# Patient Record
Sex: Male | Born: 1970 | Race: Black or African American | Hispanic: No | Marital: Married | Smoking: Never smoker
Health system: Southern US, Community
[De-identification: ages and names within clinical notes are randomized; demographics above are authoritative.]

## PROBLEM LIST (undated history)

## (undated) HISTORY — PX: EYE SURGERY: SHX253

---

## 2016-06-28 ENCOUNTER — Emergency Department (HOSPITAL_COMMUNITY)

## 2016-06-28 ENCOUNTER — Encounter (HOSPITAL_COMMUNITY): Payer: Self-pay | Admitting: Emergency Medicine

## 2016-06-28 ENCOUNTER — Emergency Department (HOSPITAL_COMMUNITY)
Admission: EM | Admit: 2016-06-28 | Discharge: 2016-06-28 | Disposition: A | Discharge status: Home / Self Care | Attending: Emergency Medicine | Admitting: Emergency Medicine

## 2016-06-28 DIAGNOSIS — R0789 Other chest pain: Secondary | ICD-10-CM | POA: Diagnosis not present

## 2016-06-28 DIAGNOSIS — Z79899 Other long term (current) drug therapy: Secondary | ICD-10-CM | POA: Diagnosis not present

## 2016-06-28 DIAGNOSIS — R079 Chest pain, unspecified: Secondary | ICD-10-CM | POA: Diagnosis present

## 2016-06-28 LAB — BASIC METABOLIC PANEL
ANION GAP: 7 (ref 5–15)
BUN: 10 mg/dL (ref 6–20)
CALCIUM: 9.1 mg/dL (ref 8.9–10.3)
CO2: 26 mmol/L (ref 22–32)
Chloride: 103 mmol/L (ref 101–111)
Creatinine, Ser: 0.93 mg/dL (ref 0.61–1.24)
Glucose, Bld: 98 mg/dL (ref 65–99)
Potassium: 3.8 mmol/L (ref 3.5–5.1)
SODIUM: 136 mmol/L (ref 135–145)

## 2016-06-28 LAB — CBC
HCT: 45.1 % (ref 39.0–52.0)
HEMOGLOBIN: 16 g/dL (ref 13.0–17.0)
MCH: 29.9 pg (ref 26.0–34.0)
MCHC: 35.5 g/dL (ref 30.0–36.0)
MCV: 84.1 fL (ref 78.0–100.0)
PLATELETS: 239 10*3/uL (ref 150–400)
RBC: 5.36 MIL/uL (ref 4.22–5.81)
RDW: 13.5 % (ref 11.5–15.5)
WBC: 4.5 10*3/uL (ref 4.0–10.5)

## 2016-06-28 LAB — I-STAT TROPONIN, ED
TROPONIN I, POC: 0 ng/mL (ref 0.00–0.08)
Troponin i, poc: 0 ng/mL (ref 0.00–0.08)

## 2016-06-28 MED ORDER — ASPIRIN 81 MG PO CHEW
324.0000 mg | CHEWABLE_TABLET | Freq: Once | ORAL | Status: AC
  Administered 2016-06-28: 324 mg via ORAL
  Filled 2016-06-28: qty 4

## 2016-06-28 MED ORDER — IBUPROFEN 600 MG PO TABS: 600.0000 mg | ORAL_TABLET | Freq: Four times a day (QID) | ORAL | 0 refills | Status: AC | PRN

## 2016-06-28 NOTE — ED Provider Notes (Signed)
[facility]-EMERGENCY DEPT Provider Note   CSN: [id number] Arrival date & time: 06/28/16  0702     History   Chief Complaint Chief Complaint  Patient presents with  . Chest Pain    HPI Erroll LunaJacque Aleman is a 45 y.o. male.  Pt presents to the ED this AM with pain to the right side of his chest.  He said he woke up with the pain.  The pain seems to hurt more with movement.  The pt said that it feels like a tap.  No pressure.  No sob, no n/v.      History reviewed. No pertinent past medical history.  There are no active problems to display for this patient.   Past Surgical History:  Procedure Laterality Date  . EYE SURGERY         Home Medications    Prior to Admission medications   Medication Sig Start Date End Date Taking? Authorizing Provider  ibuprofen (ADVIL,MOTRIN) 600 MG tablet Take 1 tablet (600 mg total) by mouth every 6 (six) hours as needed. 06/28/16   Jacalyn LefevreJulie Chayim Bialas, MD    Family History Family History  Problem Relation Age of Onset  . Adopted: Yes    Social History Social History  Substance Use Topics  . Smoking status: Never Smoker  . Smokeless tobacco: Never Used  . Alcohol use No     Allergies   Shellfish allergy   Review of Systems Review of Systems  Cardiovascular: Positive for chest pain.  All other systems reviewed and are negative.    Physical Exam Updated Vital Signs BP (!) 121/105   Pulse 66   Temp 98.1 F (36.7 C) (Oral)   Resp 22   Ht 5\' 7"  (1.702 m)   Wt 202 lb (91.6 kg)   SpO2 98%   BMI 31.64 kg/m   Physical Exam  Constitutional: He is oriented to person, place, and time. He appears well-developed and well-nourished.  HENT:  Head: Normocephalic and atraumatic.  Right Ear: External ear normal.  Left Ear: External ear normal.  Nose: Nose normal.  Mouth/Throat: Oropharynx is clear and moist.  Eyes: Conjunctivae and EOM are normal. Pupils are equal, round, and reactive to light.  Neck: Normal range of motion. Neck  supple.  Cardiovascular: Normal rate, regular rhythm, normal heart sounds and intact distal pulses.   Pulmonary/Chest: Effort normal and breath sounds normal.  Abdominal: Soft. Bowel sounds are normal.  Musculoskeletal: Normal range of motion.  Neurological: He is alert and oriented to person, place, and time.  Skin: Skin is warm.  Psychiatric: He has a normal mood and affect. His behavior is normal. Judgment and thought content normal.  Nursing note and vitals reviewed.    ED Treatments / Results  Labs (all labs ordered are listed, but only abnormal results are displayed) Labs Reviewed  BASIC METABOLIC PANEL  CBC  I-STAT TROPOININ, ED  Rosezena SensorI-STAT TROPOININ, ED    EKG  EKG Interpretation  Date/Time:  Tuesday June 28 2016 07:09:47 EST Ventricular Rate:  73 PR Interval:    QRS Duration: 85 QT Interval:  356 QTC Calculation: 393 R Axis:   20 Text Interpretation:  Sinus rhythm Confirmed by Particia NearingHAVILAND MD, Aras Albarran ([id number]) on 06/28/2016 7:38:13 AM       Radiology Dg Chest 2 View  Result Date: 06/28/2016 CLINICAL DATA:  New right chest pain since this morning. EXAM: CHEST  2 VIEW COMPARISON:  None. FINDINGS: The heart size and mediastinal contours are within normal limits. There is  no focal infiltrate, pulmonary edema, or pleural effusion. There is scoliosis of spine. The visualized skeletal structures are otherwise unremarkable. IMPRESSION: No active cardiopulmonary disease. Electronically Signed   By: Sherian ReinWei-Chen  Lin M.D.   On: 06/28/2016 07:50    Procedures Procedures (including critical care time)  Medications Ordered in ED Medications  aspirin chewable tablet 324 mg (324 mg Oral Given 06/28/16 0754)     Initial Impression / Assessment and Plan / ED Course  I have reviewed the triage vital signs and the nursing notes.  Pertinent labs & imaging results that were available during my care of the patient were reviewed by me and considered in my medical decision making (see  chart for details).  Clinical Course    2 sets of negative troponins in a patient with very low risk for cad.  Pt ok for d/c.  I think sx are msk in origin.   Pt has insurance, but no doctor.  I told him that he needs to establish pcp to recheck bp as it has been borderline high here.  Pt knows to return if worse.  Final Clinical Impressions(s) / ED Diagnoses   Final diagnoses:  Atypical chest pain    New Prescriptions New Prescriptions   IBUPROFEN (ADVIL,MOTRIN) 600 MG TABLET    Take 1 tablet (600 mg total) by mouth every 6 (six) hours as needed.     Jacalyn LefevreJulie Caidin Heidenreich, MD 06/28/16 [id number]

## 2016-06-28 NOTE — ED Triage Notes (Signed)
Pt states he woke up this morning with pain in his right chest  Pt states the more he moved around this morning the worse it got  Pt states it feels like a pulling sensation  Denies any sxs associated with the pain

## 2018-05-28 IMAGING — CR DG CHEST 2V
2 series · 2 of 2 positions shown · non-contrast
Comparison: None.

CLINICAL DATA: New right chest pain since this morning.

EXAM:
CHEST  2 VIEW

[w chest pa]
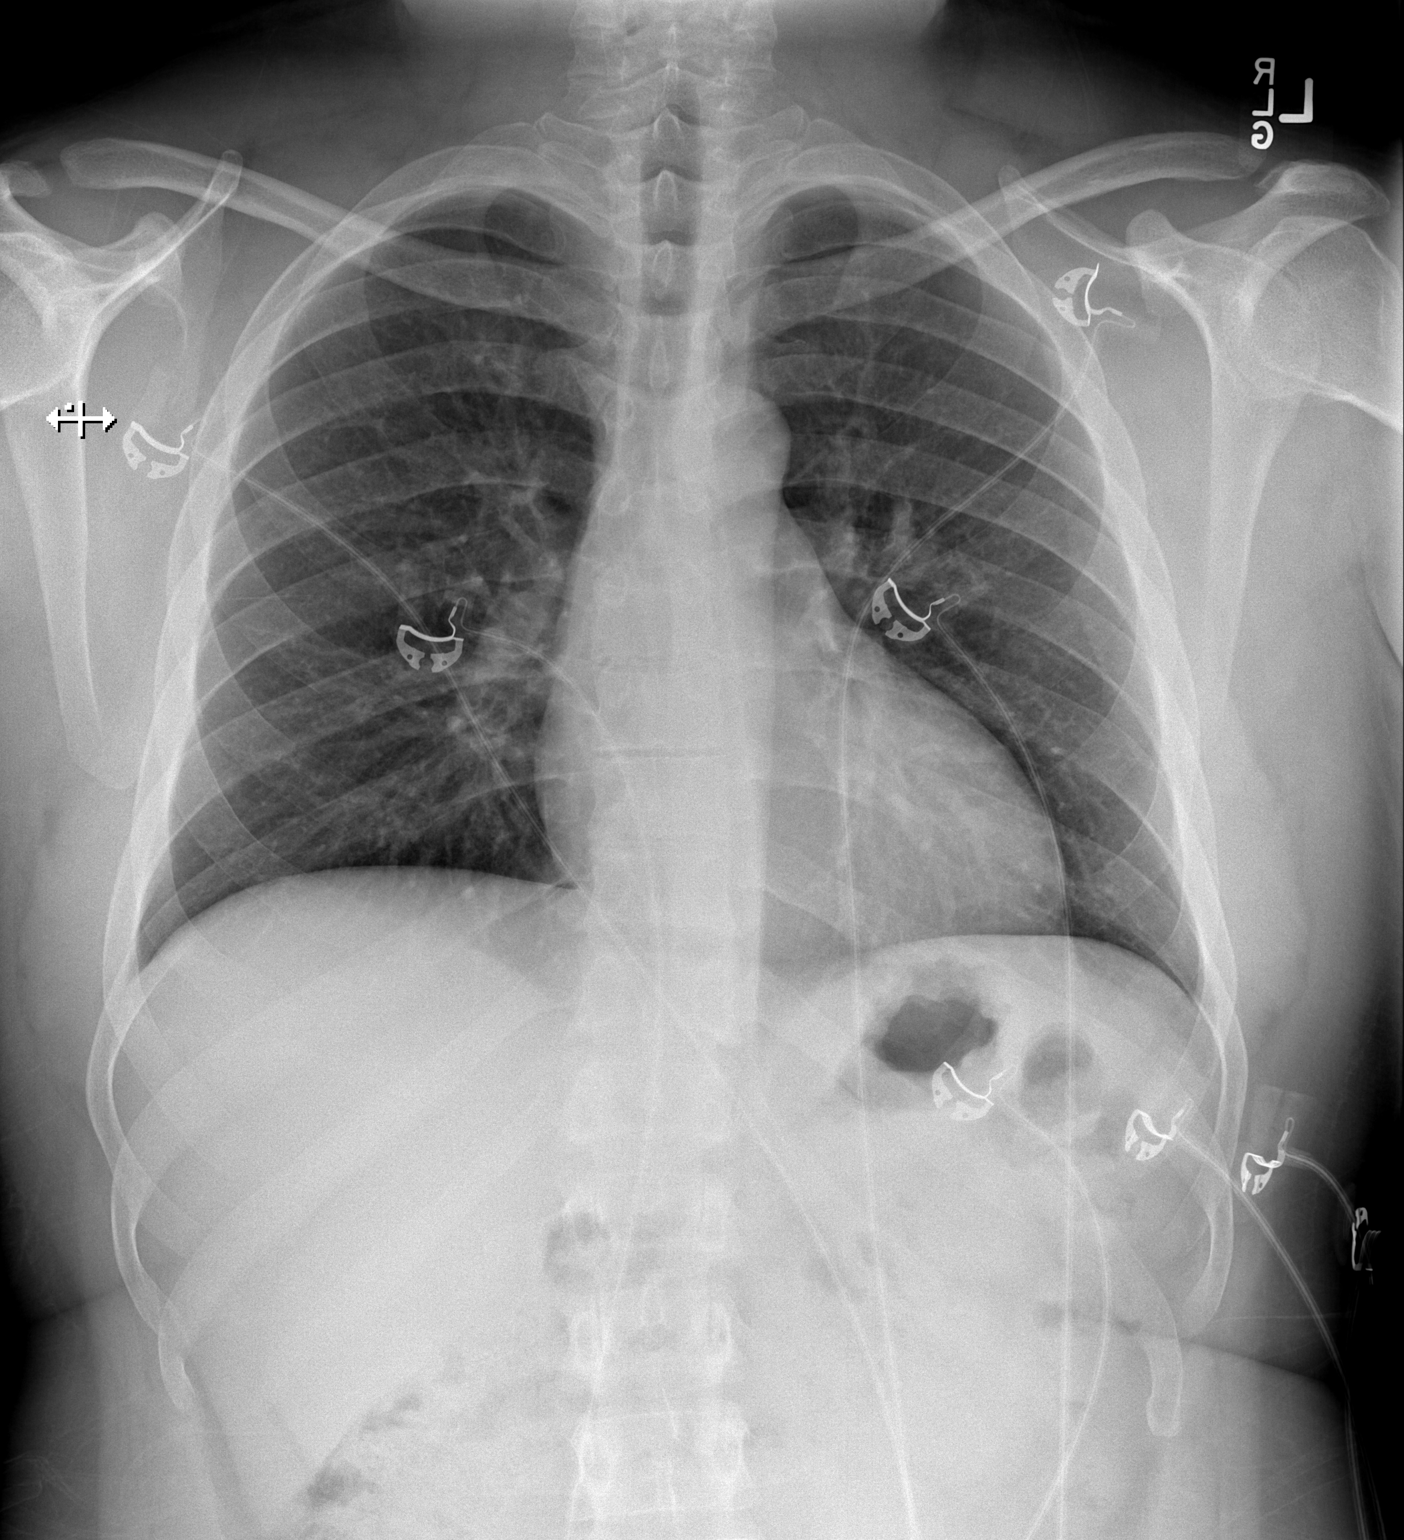

[w chest lat]
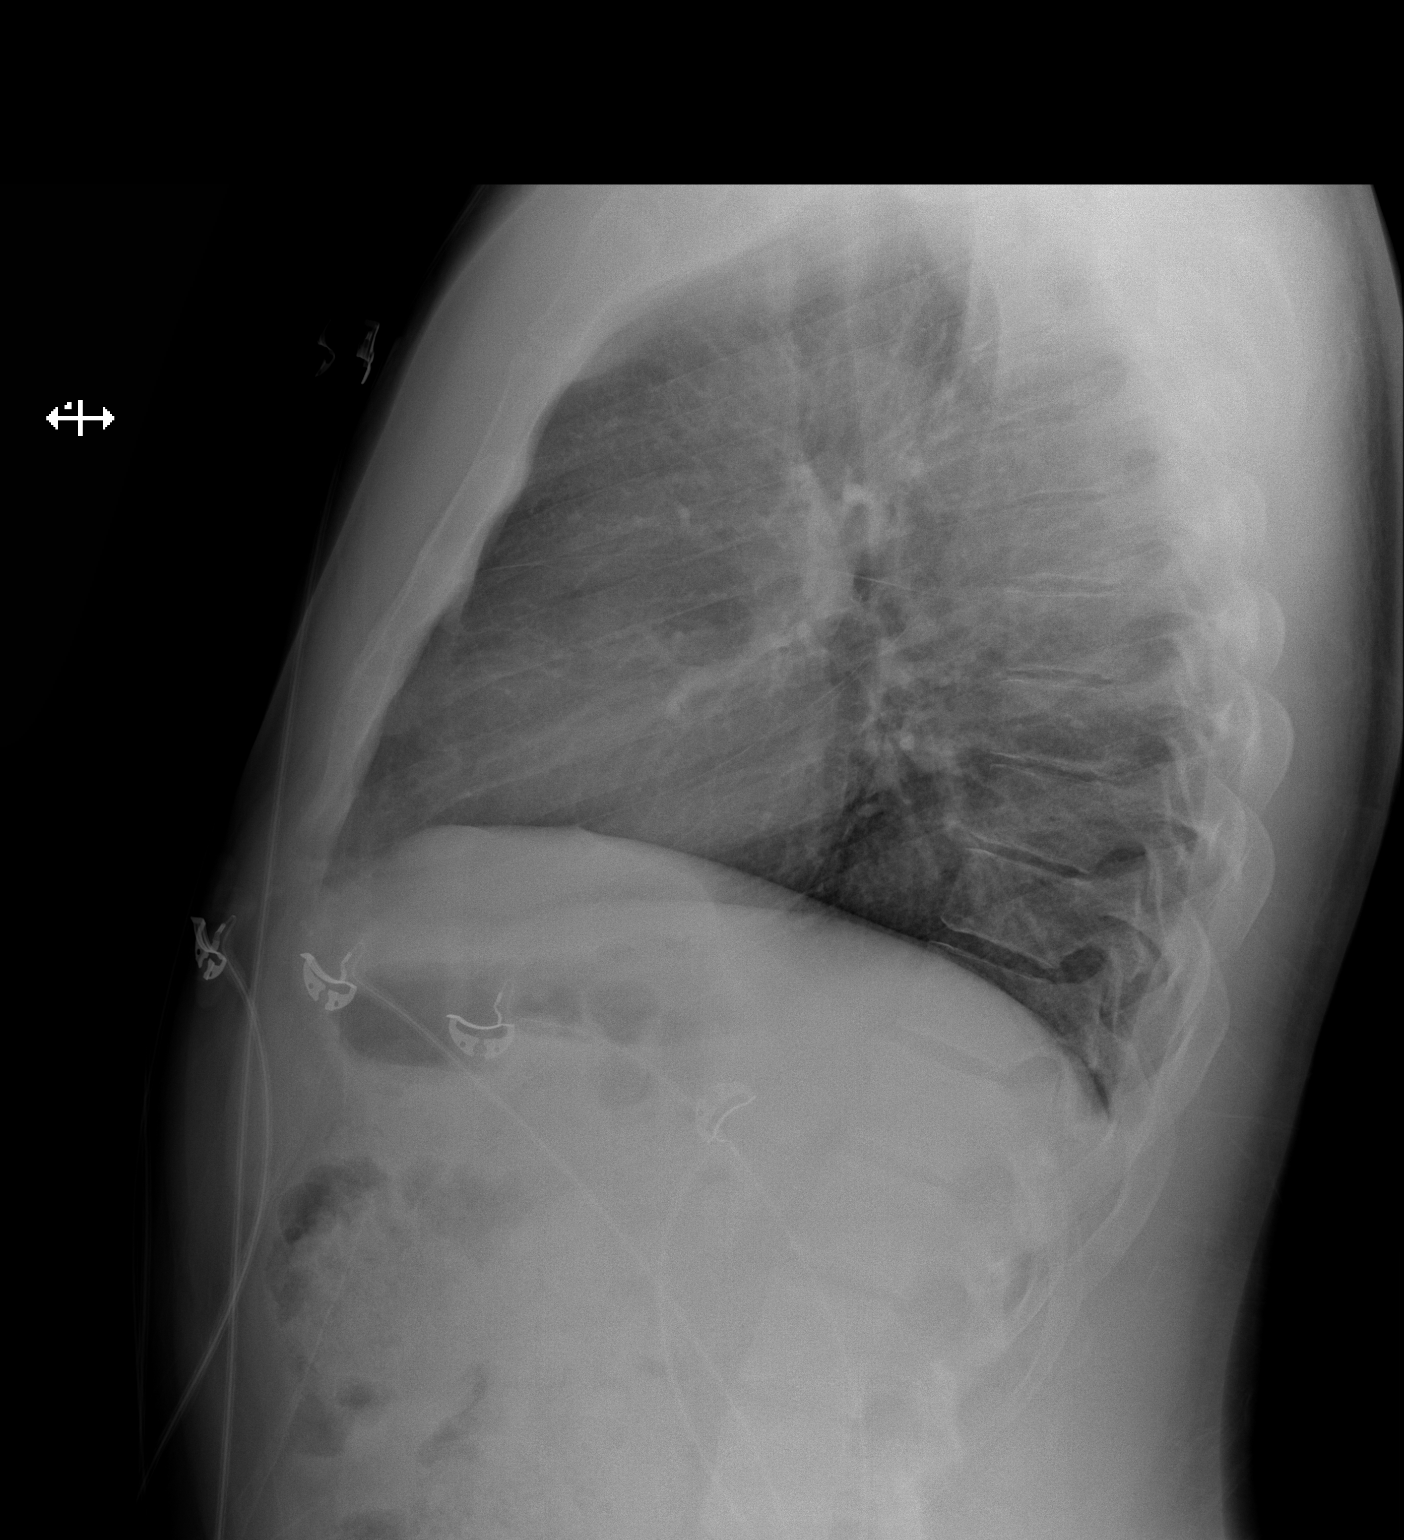

[2 of 2 positions shown; findings below may reference images not displayed]

FINDINGS: The heart size and mediastinal contours are within normal limits.
There is no focal infiltrate, pulmonary edema, or pleural effusion.
There is scoliosis of spine. The visualized skeletal structures are
otherwise unremarkable.
IMPRESSION: No active cardiopulmonary disease.

## 2023-02-12 ENCOUNTER — Encounter (HOSPITAL_BASED_OUTPATIENT_CLINIC_OR_DEPARTMENT_OTHER): Payer: Self-pay | Admitting: Emergency Medicine

## 2023-02-12 ENCOUNTER — Other Ambulatory Visit: Payer: Self-pay

## 2023-02-12 ENCOUNTER — Emergency Department (HOSPITAL_BASED_OUTPATIENT_CLINIC_OR_DEPARTMENT_OTHER)
Admission: EM | Admit: 2023-02-12 | Discharge: 2023-02-12 | Disposition: A | Discharge status: Home / Self Care | Attending: Emergency Medicine | Admitting: Emergency Medicine

## 2023-02-12 DIAGNOSIS — M25512 Pain in left shoulder: Secondary | ICD-10-CM | POA: Diagnosis present

## 2023-02-12 DIAGNOSIS — M62838 Other muscle spasm: Secondary | ICD-10-CM | POA: Diagnosis not present

## 2023-02-12 DIAGNOSIS — M62830 Muscle spasm of back: Secondary | ICD-10-CM

## 2023-02-12 MED ORDER — LIDOCAINE 5 % EX PTCH: 1.0000 | MEDICATED_PATCH | CUTANEOUS | 0 refills | Status: AC

## 2023-02-12 MED ORDER — LIDOCAINE 5 % EX PTCH
1.0000 | MEDICATED_PATCH | CUTANEOUS | Status: DC
  Administered 2023-02-12: 1 via TRANSDERMAL
  Filled 2023-02-12: qty 1

## 2023-02-12 MED ORDER — KETOROLAC TROMETHAMINE 30 MG/ML IJ SOLN
30.0000 mg | Freq: Once | INTRAMUSCULAR | Status: AC
  Administered 2023-02-12: 30 mg via INTRAMUSCULAR
  Filled 2023-02-12: qty 1

## 2023-02-12 MED ORDER — METHOCARBAMOL 500 MG PO TABS
500.0000 mg | ORAL_TABLET | Freq: Once | ORAL | Status: AC
  Administered 2023-02-12: 500 mg via ORAL
  Filled 2023-02-12: qty 1

## 2023-02-12 MED ORDER — NAPROXEN 500 MG PO TABS: 500.0000 mg | ORAL_TABLET | Freq: Two times a day (BID) | ORAL | 0 refills | Status: AC

## 2023-02-12 MED ORDER — METHOCARBAMOL 500 MG PO TABS: 500.0000 mg | ORAL_TABLET | Freq: Two times a day (BID) | ORAL | 0 refills | Status: AC

## 2023-02-12 NOTE — ED Triage Notes (Signed)
Pt states he noticed L shoulder pain when he was trying to sleep Wednesday night. He feels its a pulled muscle from working [employer]. Pinpoints pain to shoulder blade area. He states it is worse when he is trying to lay down or sit up straight. He states when he is working he does not feel it, but it is more bothersome at rest. Also reports intermittent tingling in L arm. No noted changes to strength. Denies chest pain, SHOB or other sx.

## 2023-02-12 NOTE — ED Provider Notes (Signed)
[place] EMERGENCY DEPARTMENT AT [facility]  Provider Note  CSN: [id number] Arrival date & time: 02/12/23 0540  History Chief Complaint  Patient presents with   L shoulder pain    Tyler Baker is a 52 y.o. 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 Difficulty getting comfortable at night. Some aching/tingling in L arm. No weakness.    Home Medications Prior to Admission medications   Medication Sig Start Date End Date Taking? Authorizing Provider  lidocaine (LIDODERM) 5 % Place 1 patch onto the skin daily. Remove & Discard patch within 12 hours or as directed by MD 02/12/23  Yes Pollyann Savoy, MD  methocarbamol (ROBAXIN) 500 MG tablet Take 1 tablet (500 mg total) by mouth 2 (two) times daily. 02/12/23  Yes Pollyann Savoy, MD  naproxen (NAPROSYN) 500 MG tablet Take 1 tablet (500 mg total) by mouth 2 (two) times daily. 02/12/23  Yes Pollyann Savoy, MD  ibuprofen (ADVIL,MOTRIN) 600 MG tablet Take 1 tablet (600 mg total) by mouth every 6 (six) hours as needed. 06/28/16   Jacalyn Lefevre, MD     Allergies    Shellfish allergy   Review of Systems   Review of Systems Please see HPI for pertinent positives and negatives  Physical Exam BP (!) 159/88 (BP Location: Right Arm)   Pulse 70   Temp 98.4 F (36.9 C)   Resp 18   Ht 5\' 7"  (1.702 m)   Wt 97.5 kg   SpO2 98%   BMI 33.67 kg/m   Physical Exam Vitals and nursing note reviewed.  Constitutional:      Appearance: Normal appearance.  HENT:     Head: Normocephalic and atraumatic.     Nose: Nose normal.     Mouth/Throat:     Mouth: Mucous membranes are moist.  Eyes:     Extraocular Movements: Extraocular movements intact.     Conjunctiva/sclera: Conjunctivae normal.  Cardiovascular:     Rate and Rhythm: Normal rate.      Pulses: Normal pulses.  Pulmonary:     Effort: Pulmonary effort is normal.     Breath sounds: Normal breath sounds. No wheezing.  Abdominal:     General: Abdomen is flat.     Palpations: Abdomen is soft.     Tenderness: There is no abdominal tenderness.  Musculoskeletal:        General: Tenderness (tenderness over the L trapezius) present. No swelling. Normal range of motion.     Cervical back: Neck supple.     Comments: Negative phalen's and tinel's signs.   Skin:    General: Skin is warm and dry.  Neurological:     General: No focal deficit present.     Mental Status: He is alert.  Psychiatric:        Mood and Affect: Mood normal.     ED Results / Procedures / Treatments   EKG None  Procedures Procedures  Medications Ordered in the ED Medications  ketorolac (TORADOL) 30 MG/ML injection 30 mg (has no administration in time range)  methocarbamol (ROBAXIN) tablet 500 mg (has no administration in time range)  lidocaine (LIDODERM) 5 % 1 patch (has no administration in time range)    Initial Impression and Plan  Patient here with MSK L shoulder pain, likely trapezius  spasm. Plan Toradol, Robaxin and lidoderm here. Rx for Naprosyn. Rest, heat, massage and PCP follow up, RTED for any other concerns.    ED Course       MDM Rules/Calculators/A&P Medical Decision Making Problems Addressed: Spasm of left trapezius muscle: acute illness or injury  Risk Prescription drug management.     Final Clinical Impression(s) / ED Diagnoses Final diagnoses:  Spasm of left trapezius muscle    Rx / DC Orders ED Discharge Orders          Ordered    lidocaine (LIDODERM) 5 %  Every 24 hours        02/12/23 0606    methocarbamol (ROBAXIN) 500 MG tablet  2 times daily        02/12/23 0606    naproxen (NAPROSYN) 500 MG tablet  2 times daily        02/12/23 0606             Pollyann Savoy, MD 02/12/23 [phone]

## 2023-02-12 NOTE — ED Notes (Signed)
Reviewed AVS with patient, patient expressed understanding of directions, denies further questions at this time. 

## 2023-07-09 ENCOUNTER — Other Ambulatory Visit: Payer: Self-pay

## 2023-07-09 ENCOUNTER — Emergency Department (HOSPITAL_BASED_OUTPATIENT_CLINIC_OR_DEPARTMENT_OTHER)
Admission: EM | Admit: 2023-07-09 | Discharge: 2023-07-09 | Disposition: A | Discharge status: Home / Self Care | Attending: Emergency Medicine | Admitting: Emergency Medicine

## 2023-07-09 ENCOUNTER — Encounter (HOSPITAL_BASED_OUTPATIENT_CLINIC_OR_DEPARTMENT_OTHER): Payer: Self-pay

## 2023-07-09 DIAGNOSIS — R059 Cough, unspecified: Secondary | ICD-10-CM | POA: Diagnosis present

## 2023-07-09 DIAGNOSIS — J069 Acute upper respiratory infection, unspecified: Secondary | ICD-10-CM | POA: Insufficient documentation

## 2023-07-09 MED ORDER — ONDANSETRON 4 MG PO TBDP: ORAL_TABLET | ORAL | 0 refills | Status: AC

## 2023-07-09 MED ORDER — BENZONATATE 100 MG PO CAPS: 100.0000 mg | ORAL_CAPSULE | Freq: Three times a day (TID) | ORAL | 0 refills | Status: AC

## 2023-07-09 NOTE — ED Triage Notes (Signed)
Pt reports persistent cough for 1 day. Pt states mucous goes back and forth between brown and clear. Mild fever 101F yesterday. No fever last night per patient. (-) Flu, (-) Covid test at home. Denies SOB.

## 2023-07-09 NOTE — Discharge Instructions (Signed)
Take tylenol 2 pills 4 times a day and motrin 4 pills 3 times a day.  Drink plenty of fluids.  Return for worsening shortness of breath, headache, confusion. Follow up with your family doctor.   

## 2023-07-09 NOTE — ED Provider Notes (Signed)
[place] EMERGENCY DEPARTMENT AT [facility] Provider Note   CSN: [id number] Arrival date & time: 07/09/23  0555     History  Chief Complaint  Patient presents with   Cough    Tyler Baker is a 52 y.o. male.  52 yo M chief complaints of cough and congestion.  Going on since yesterday.  Had a fever as high as 101.  He took a home COVID and flu test that was negative.  Needled his wife that his sputum was brown and so she brought him here for evaluation.  He denies any difficulty breathing.  Feels like he is making and drinking normally.  His supervisor was sick recently but he does not think that he was in close contact with them.   Cough      Home Medications Prior to Admission medications   Medication Sig Start Date End Date Taking? Authorizing Provider  benzonatate (TESSALON) 100 MG capsule Take 1 capsule (100 mg total) by mouth every 8 (eight) hours. 07/09/23  Yes Melene Plan, DO  ondansetron (ZOFRAN-ODT) 4 MG disintegrating tablet 4mg  ODT q4 hours prn nausea/vomit 07/09/23  Yes Melene Plan, DO  ibuprofen (ADVIL,MOTRIN) 600 MG tablet Take 1 tablet (600 mg total) by mouth every 6 (six) hours as needed. 06/28/16   Jacalyn Lefevre, MD  lidocaine (LIDODERM) 5 % Place 1 patch onto the skin daily. Remove & Discard patch within 12 hours or as directed by MD 02/12/23   Pollyann Savoy, MD  methocarbamol (ROBAXIN) 500 MG tablet Take 1 tablet (500 mg total) by mouth 2 (two) times daily. 02/12/23   Pollyann Savoy, MD  naproxen (NAPROSYN) 500 MG tablet Take 1 tablet (500 mg total) by mouth 2 (two) times daily. 02/12/23   Pollyann Savoy, MD      Allergies    Shellfish allergy    Review of Systems   Review of Systems  Respiratory:  Positive for cough.     Physical Exam Updated Vital Signs BP (!) 147/96 (BP Location: Right Arm)   Pulse 84   Temp 98.4 F (36.9 C) (Oral)   Resp 15   SpO2 96%  Physical Exam Vitals and nursing note reviewed.  Constitutional:       Appearance: He is well-developed.  HENT:     Head: Normocephalic and atraumatic.     Comments: Swollen turbinates, posterior nasal drip, no noted sinus ttp, tm normal bilaterally.   Eyes:     Pupils: Pupils are equal, round, and reactive to light.  Neck:     Vascular: No JVD.  Cardiovascular:     Rate and Rhythm: Normal rate and regular rhythm.     Heart sounds: No murmur heard.    No friction rub. No gallop.  Pulmonary:     Effort: No respiratory distress.     Breath sounds: No wheezing.  Abdominal:     General: There is no distension.     Tenderness: There is no abdominal tenderness. There is no guarding or rebound.  Musculoskeletal:        General: Normal range of motion.     Cervical back: Normal range of motion and neck supple.  Skin:    Coloration: Skin is not pale.     Findings: No rash.  Neurological:     Mental Status: He is alert and oriented to person, place, and time.  Psychiatric:        Behavior: Behavior normal.     ED Results /  Procedures / Treatments   Labs (all labs ordered are listed, but only abnormal results are displayed) Labs Reviewed - No data to display  EKG None  Radiology No results found.  Procedures Procedures    Medications Ordered in ED Medications - No data to display  ED Course/ Medical Decision Making/ A&P                                 Medical Decision Making Risk Prescription drug management.   52 yo M with a chief complaints of cough congestion going on for about 24 to 48 hours.  Having fevers at home as well.  He is well-appearing nontoxic.  Clear lung sounds for me.  No bacterial source was found on exam.  Will treat supportively.  PCP follow-up.  6:18 AM:  I have discussed the diagnosis/risks/treatment options with the patient and family.  Evaluation and diagnostic testing in the emergency department does not suggest an emergent condition requiring admission or immediate intervention beyond what has been  performed at this time.  They will follow up with PCP. We also discussed returning to the ED immediately if new or worsening sx occur. We discussed the sx which are most concerning (e.g., sudden worsening pain, fever, inability to tolerate by mouth) that necessitate immediate return. Medications administered to the patient during their visit and any new prescriptions provided to the patient are listed below.  Medications given during this visit Medications - No data to display   The patient appears reasonably screen and/or stabilized for discharge and I doubt any other medical condition or other [facility] requiring further screening, evaluation, or treatment in the ED at this time prior to discharge.          Final Clinical Impression(s) / ED Diagnoses Final diagnoses:  Viral URI with cough    Rx / DC Orders ED Discharge Orders          Ordered    benzonatate (TESSALON) 100 MG capsule  Every 8 hours        07/09/23 0616    ondansetron (ZOFRAN-ODT) 4 MG disintegrating tablet        07/09/23 0616              Melene Plan, DO 07/09/23 [phone]

## 2023-07-30 ENCOUNTER — Emergency Department (HOSPITAL_BASED_OUTPATIENT_CLINIC_OR_DEPARTMENT_OTHER)
Admission: EM | Admit: 2023-07-30 | Discharge: 2023-07-30 | Disposition: A | Discharge status: Home / Self Care | Attending: Emergency Medicine | Admitting: Emergency Medicine

## 2023-07-30 ENCOUNTER — Emergency Department (HOSPITAL_BASED_OUTPATIENT_CLINIC_OR_DEPARTMENT_OTHER)

## 2023-07-30 ENCOUNTER — Encounter (HOSPITAL_BASED_OUTPATIENT_CLINIC_OR_DEPARTMENT_OTHER): Payer: Self-pay | Admitting: Emergency Medicine

## 2023-07-30 DIAGNOSIS — J069 Acute upper respiratory infection, unspecified: Secondary | ICD-10-CM | POA: Insufficient documentation

## 2023-07-30 DIAGNOSIS — Z20822 Contact with and (suspected) exposure to covid-19: Secondary | ICD-10-CM | POA: Insufficient documentation

## 2023-07-30 DIAGNOSIS — R059 Cough, unspecified: Secondary | ICD-10-CM | POA: Diagnosis present

## 2023-07-30 DIAGNOSIS — J101 Influenza due to other identified influenza virus with other respiratory manifestations: Secondary | ICD-10-CM | POA: Insufficient documentation

## 2023-07-30 DIAGNOSIS — J111 Influenza due to unidentified influenza virus with other respiratory manifestations: Secondary | ICD-10-CM

## 2023-07-30 LAB — RESP PANEL BY RT-PCR (RSV, FLU A&B, COVID)  RVPGX2
Influenza A by PCR: POSITIVE — AB
Influenza B by PCR: NEGATIVE
Resp Syncytial Virus by PCR: NEGATIVE
SARS Coronavirus 2 by RT PCR: NEGATIVE

## 2023-07-30 MED ORDER — ACETAMINOPHEN 325 MG PO TABS
650.0000 mg | ORAL_TABLET | Freq: Once | ORAL | Status: AC
  Administered 2023-07-30: 650 mg via ORAL
  Filled 2023-07-30: qty 2

## 2023-07-30 MED ORDER — OSELTAMIVIR PHOSPHATE 75 MG PO CAPS
75.0000 mg | ORAL_CAPSULE | Freq: Once | ORAL | Status: AC
  Administered 2023-07-30: 75 mg via ORAL
  Filled 2023-07-30: qty 1

## 2023-07-30 MED ORDER — BENZONATATE 100 MG PO CAPS
100.0000 mg | ORAL_CAPSULE | Freq: Once | ORAL | Status: AC
  Administered 2023-07-30: 100 mg via ORAL
  Filled 2023-07-30: qty 1

## 2023-07-30 MED ORDER — ONDANSETRON HCL 4 MG PO TABS
4.0000 mg | ORAL_TABLET | Freq: Once | ORAL | Status: DC
  Filled 2023-07-30: qty 1

## 2023-07-30 MED ORDER — ONDANSETRON 4 MG PO TBDP
4.0000 mg | ORAL_TABLET | Freq: Once | ORAL | Status: AC
  Administered 2023-07-30: 4 mg via ORAL

## 2023-07-30 MED ORDER — ONDANSETRON 4 MG PO TBDP
ORAL_TABLET | ORAL | Status: AC
  Filled 2023-07-30: qty 1

## 2023-07-30 MED ORDER — OSELTAMIVIR PHOSPHATE 75 MG PO CAPS: 75.0000 mg | ORAL_CAPSULE | Freq: Two times a day (BID) | ORAL | 0 refills | Status: AC

## 2023-07-30 MED ORDER — ONDANSETRON HCL 4 MG PO TABS: 4.0000 mg | ORAL_TABLET | Freq: Four times a day (QID) | ORAL | 0 refills | Status: AC | PRN

## 2023-07-30 NOTE — ED Triage Notes (Signed)
 Pt reports cough x 24 hrs

## 2023-07-30 NOTE — ED Provider Notes (Signed)
  EMERGENCY DEPARTMENT AT [facility] Provider Note   CSN: [id number] Arrival date & time: 07/30/23  1750     History Chief Complaint  Patient presents with   Cough    HPI Tyler Baker is a 53 y.o. male presenting for fever cough congestion for 24 hours.  Recently ill with a URI.  He spontaneously improved.  However starting this morning he had sudden onset severe cough and fever.  Some mild shortness of breath. Ambulatory tolerating p.o. intake just generally ill.   Patient's recorded medical, surgical, social, medication list and allergies were reviewed in the Snapshot window as part of the initial history.   Review of Systems   Review of Systems  Constitutional:  Positive for fever. Negative for chills.  HENT:  Positive for congestion. Negative for ear pain and sore throat.   Eyes:  Negative for pain and visual disturbance.  Respiratory:  Positive for cough. Negative for shortness of breath.   Cardiovascular:  Negative for chest pain and palpitations.  Gastrointestinal:  Negative for abdominal pain and vomiting.  Genitourinary:  Negative for dysuria and hematuria.  Musculoskeletal:  Negative for arthralgias and back pain.  Skin:  Negative for color change and rash.  Neurological:  Negative for seizures and syncope.  All other systems reviewed and are negative.   Physical Exam Updated Vital Signs BP 110/75   Pulse 95   Temp 99.2 F (37.3 C) (Oral)   Resp 16   Ht 5' 7 (1.702 m)   Wt 97.5 kg   SpO2 97%   BMI 33.67 kg/m  Physical Exam Vitals and nursing note reviewed.  Constitutional:      General: He is not in acute distress.    Appearance: He is well-developed.  HENT:     Head: Normocephalic and atraumatic.  Eyes:     Conjunctiva/sclera: Conjunctivae normal.  Cardiovascular:     Rate and Rhythm: Normal rate and regular rhythm.     Heart sounds: No murmur heard. Pulmonary:     Effort: Pulmonary effort is normal. No respiratory  distress.     Breath sounds: Rhonchi (Audible rhonchi in the left base.  Not present in either lung field.) present.  Abdominal:     Palpations: Abdomen is soft.     Tenderness: There is no abdominal tenderness.  Musculoskeletal:        General: No swelling.     Cervical back: Neck supple.  Skin:    General: Skin is warm and dry.     Capillary Refill: Capillary refill takes less than 2 seconds.  Neurological:     Mental Status: He is alert.  Psychiatric:        Mood and Affect: Mood normal.      ED Course/ Medical Decision Making/ A&P    Procedures Procedures   Medications Ordered in ED Medications  oseltamivir  (TAMIFLU ) capsule 75 mg (has no administration in time range)  acetaminophen  (TYLENOL ) tablet 650 mg (650 mg Oral Given 07/30/23 1806)  benzonatate  (TESSALON ) capsule 100 mg (100 mg Oral Given 07/30/23 1841)  ondansetron  (ZOFRAN -ODT) disintegrating tablet 4 mg (4 mg Oral Given 07/30/23 1846)   Medical Decision Making:   Tyler Baker is a 53 y.o. male who presented to the ED today with subjective fever, cough, congestion detailed above.    Additional history discussed with patient's family/caregivers.  Patient placed on continuous vitals and telemetry monitoring while in ED which was reviewed periodically.   Complete initial physical exam performed, notably  the patient  was hemodynamically stable in no acute distress.  Posterior oropharynx illuminated and without obvious swelling or deformity.  Patient is without neck stiffness.    Reviewed and confirmed nursing documentation for past medical history, family history, social history.    Initial Assessment:   With the patient's presentation of fever cough congestion, most likely diagnosis is developing viral upper respiratory infection. Other diagnoses were considered including (but not limited to) peritonsillar abscess, retropharyngeal abscess, pneumonia. These are considered less likely due to history of present illness and  physical exam findings.   This is most consistent with an acute life/limb threatening illness complicated by underlying chronic conditions. Considered meningitis, however patient's symptoms, vital signs, physical exam findings including lack of meningismus seem grossly less consistent at this time. Initial Plan:  Viral screening including COVID/flu testing to evaluate for common viral etiologies that need to be tracked CXR to evaluate for structural/infectious intrathoracic pathology.  Empiric treatment with antipyretics including acetaminophen  in ambulatory setting Objective evaluation as below reviewed   Initial Study Results:   Laboratory  All laboratory results reviewed without evidence of clinically relevant pathology.    Radiology:  All images reviewed independently. Agree with radiology report at this time.   DG Chest 2 View  Final Result      Final Assessment and Plan:   On reassessment, patient is ambulatory tolerating p.o. intake in no acute distress.   Flu A positive.  Treated with Tamiflu  and Zofran  given respiratory symptoms and medical history. Patient is currently stable for outpatient care and management with no indication for hospitalization or transfer at this time.  Discussed all findings with patient expressed understanding.  Disposition:  Based on the above findings, I believe patient is stable for discharge.    Patient/family educated about specific return precautions for given chief complaint and symptoms.  Patient/family educated about follow-up with PCP.     Patient/family expressed understanding of return precautions and need for follow-up. Patient spoken to regarding all imaging and laboratory results and appropriate follow up for these results. All education provided in verbal form with additional information in written form. Time was allowed for answering of patient questions. Patient discharged.    Emergency Department Medication Summary:   Medications   oseltamivir  (TAMIFLU ) capsule 75 mg (has no administration in time range)  acetaminophen  (TYLENOL ) tablet 650 mg (650 mg Oral Given 07/30/23 1806)  benzonatate  (TESSALON ) capsule 100 mg (100 mg Oral Given 07/30/23 1841)  ondansetron  (ZOFRAN -ODT) disintegrating tablet 4 mg (4 mg Oral Given 07/30/23 1846)    Clinical Impression:  1. Upper respiratory tract infection, unspecified type   2. Flu      Discharge   Final Clinical Impression(s) / ED Diagnoses Final diagnoses:  Upper respiratory tract infection, unspecified type  Flu    Rx / DC Orders ED Discharge Orders          Ordered    ondansetron  (ZOFRAN ) 4 MG tablet  Every 6 hours PRN        07/30/23 1944    oseltamivir  (TAMIFLU ) 75 MG capsule  Every 12 hours        07/30/23 1944              Jerral Meth, MD 07/30/23 1945
